# Patient Record
Sex: Female | Born: 2000 | Race: White | Hispanic: No | Marital: Single | Smoking: Never smoker
Health system: Southern US, Community
[De-identification: ages and names within clinical notes are randomized; demographics above are authoritative.]

## PROBLEM LIST (undated history)

## (undated) DIAGNOSIS — A419 Sepsis, unspecified organism: Secondary | ICD-10-CM

## (undated) DIAGNOSIS — Q210 Ventricular septal defect: Secondary | ICD-10-CM

## (undated) DIAGNOSIS — I38 Endocarditis, valve unspecified: Secondary | ICD-10-CM

## (undated) DIAGNOSIS — E282 Polycystic ovarian syndrome: Secondary | ICD-10-CM

## (undated) DIAGNOSIS — Z8669 Personal history of other diseases of the nervous system and sense organs: Secondary | ICD-10-CM

## (undated) HISTORY — PX: OTHER SURGICAL HISTORY: SHX169

## (undated) HISTORY — DX: Endocarditis, valve unspecified: I38

## (undated) HISTORY — DX: Ventricular septal defect: Q21.0

## (undated) HISTORY — DX: Sepsis, unspecified organism: A41.9

## (undated) HISTORY — DX: Personal history of other diseases of the nervous system and sense organs: Z86.69

---
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
## 2018-10-22 ENCOUNTER — Ambulatory Visit (HOSPITAL_COMMUNITY)
Admission: EM | Admit: 2018-10-22 | Discharge: 2018-10-22 | Disposition: A | Discharge status: Home / Self Care | Payer: BC Managed Care – PPO | Attending: Family Medicine | Admitting: Family Medicine

## 2018-10-22 ENCOUNTER — Encounter (HOSPITAL_COMMUNITY): Payer: Self-pay

## 2018-10-22 ENCOUNTER — Other Ambulatory Visit: Payer: Self-pay

## 2018-10-22 DIAGNOSIS — N939 Abnormal uterine and vaginal bleeding, unspecified: Secondary | ICD-10-CM | POA: Insufficient documentation

## 2018-10-22 DIAGNOSIS — R1030 Lower abdominal pain, unspecified: Secondary | ICD-10-CM | POA: Insufficient documentation

## 2018-10-22 LAB — POCT URINALYSIS DIP (DEVICE)
Bilirubin Urine: NEGATIVE
Glucose, UA: NEGATIVE mg/dL
Hgb urine dipstick: NEGATIVE
Ketones, ur: NEGATIVE mg/dL
Leukocytes,Ua: NEGATIVE
Nitrite: NEGATIVE
Protein, ur: NEGATIVE mg/dL
Specific Gravity, Urine: 1.02 (ref 1.005–1.030)
Urobilinogen, UA: 0.2 mg/dL (ref 0.0–1.0)
pH: 7.5 (ref 5.0–8.0)

## 2018-10-22 LAB — POCT PREGNANCY, URINE: Preg Test, Ur: NEGATIVE

## 2018-10-22 MED ORDER — KETOROLAC TROMETHAMINE 30 MG/ML IJ SOLN
INTRAMUSCULAR | Status: AC
  Filled 2018-10-22: qty 1

## 2018-10-22 MED ORDER — FLUCONAZOLE 150 MG PO TABS: 150.0000 mg | ORAL_TABLET | Freq: Every day | ORAL | 0 refills | Status: DC

## 2018-10-22 MED ORDER — KETOROLAC TROMETHAMINE 30 MG/ML IJ SOLN
30.0000 mg | Freq: Once | INTRAMUSCULAR | Status: AC
  Administered 2018-10-22: 30 mg via INTRAMUSCULAR

## 2018-10-22 NOTE — ED Provider Notes (Signed)
[facility]    CSN: [id number] Arrival date & time: 10/22/18  1058      History   Chief Complaint Chief Complaint  Patient presents with  . Abdominal Pain    HPI Laura Stokes is a 18 y.o. female.   Patient is a 18 year old female with past medical history of endocarditis, sepsis, VSD.  She presents today with lower abdominal discomfort, suprapubic pain and vaginal bleeding.  Reporting she is been bleeding for approximately 6 days.  Bleeding is light to moderate.  Reporting she has not had a menstrual cycle since having the IUD placed back in January.  Reporting she felt mildly nauseous and lightheaded this morning.  Does [place]  No obvious vaginal discharge, dysuria, hematuria or urinary frequency.  Reporting she is not currently sexually active. No fever.   ROS per HPI      Past Medical History:  Diagnosis Date  . Endocarditis   . Hx of migraines   . Sepsis (HCC)   . VSD (ventricular septal defect)    history of    Patient Active Problem List   Diagnosis Date Noted  . VSD (ventricular septal defect)   . Endocarditis   . Chest pain 12/20/2017    Past Surgical History:  Procedure Laterality Date  . None      OB History   No obstetric history on file.      Home Medications    Prior to Admission medications   Medication Sig Start Date End Date Taking? Authorizing Provider  fluconazole (DIFLUCAN) 150 MG tablet Take 1 tablet (150 mg total) by mouth daily. 10/22/18   Dahlia ByesBast, Shardae Kleinman A, NP  norethindrone (MICRONOR,CAMILA,ERRIN) 0.35 MG tablet Take 1 tablet by mouth daily.    [provider]  SUMAtriptan (IMITREX) 100 MG tablet Take 100 mg by mouth every 2 (two) hours as needed (for migraines and may repeat in 2 hours if headache persists or recurs).     [provider]    Family History Family History  Problem Relation Age of Onset  . Hyperlipidemia Father     Social History Social History   Tobacco  Use  . Smoking status: Never Smoker  . Smokeless tobacco: Never Used  Substance Use Topics  . Alcohol use: Yes    Comment: Occasional   . Drug use: Never     Allergies   Patient has no known allergies.   Review of Systems Review of Systems   Physical Exam Triage Vital Signs ED Triage Vitals  Enc Vitals Group     BP 10/22/18 1110 111/78     Pulse Rate 10/22/18 1110 72     Resp 10/22/18 1110 18     Temp 10/22/18 1110 98.6 F (37 C)     Temp Source 10/22/18 1110 Oral     SpO2 10/22/18 1110 100 %     Weight 10/22/18 1108 200 lb (90.7 kg)     Height --      Head Circumference --      Peak Flow --      Pain Score 10/22/18 1108 7     Pain Loc --      Pain Edu? --      Excl. in [place]? --    No data found.  Updated Vital Signs BP 111/78 (BP Location: Right Arm)   Pulse 72   Temp 98.6 F (37 C) (Oral)   Resp 18   Wt 200  lb (90.7 kg)   LMP 10/17/2018   SpO2 100%   BMI 29.53 kg/m   Visual Acuity Right Eye Distance:   Left Eye Distance:   Bilateral Distance:    Right Eye Near:   Left Eye Near:    Bilateral Near:     Physical Exam Vitals signs and nursing note reviewed.  Constitutional:      General: She is not in acute distress.    Appearance: She is well-developed. She is not ill-appearing, toxic-appearing or diaphoretic.  HENT:     Head: Normocephalic and atraumatic.  Pulmonary:     Effort: Pulmonary effort is normal.  Abdominal:     General: Abdomen is flat. Bowel sounds are normal. There is no distension or abdominal bruit.     Palpations: Abdomen is soft.     Tenderness: There is abdominal tenderness in the suprapubic area. There is no right CVA tenderness, left CVA tenderness, guarding or rebound.  Genitourinary:    Vagina: Vaginal discharge and bleeding present.     Cervix: No cervical motion tenderness or friability.     Comments: External vaginal exam normal without lesions, swelling  Internal exam- trace dark blood noted in vaginal vault.   Thick, white/yellow, clumpy discharge noted IUD strings visible and intact No CMT or friability Skin:    General: Skin is warm and dry.  Neurological:     Mental Status: She is alert.  Psychiatric:        Mood and Affect: Mood normal.      UC Treatments / Results  Labs (all labs ordered are listed, but only abnormal results are displayed) Labs Reviewed  POC URINE PREG, ED  POCT URINALYSIS DIP (DEVICE)  POCT PREGNANCY, URINE  CERVICOVAGINAL ANCILLARY ONLY    EKG   Radiology No results found.  Procedures Procedures (including critical care time)  Medications Ordered in UC Medications  ketorolac (TORADOL) 30 MG/ML injection 30 mg (30 mg Intramuscular Given 10/22/18 1156)  ketorolac (TORADOL) 30 MG/ML injection (has no administration in time range)    Initial Impression / Assessment and Plan / UC Course  I have reviewed the triage vital signs and the nursing notes.  Pertinent labs & imaging results that were available during my care of the patient were reviewed by me and considered in my medical decision making (see chart for details).     18 year old female with suprapubic discomfort, breakthrough bleeding Exam with minimal trace dark blood and what appears to be yeast in vaginal vault.  Treating for yeast infection. IUD strings intact  No CMT or friability lower abdominal pain and suprapubic tenderness No UTI and negative pregnancy.  Recommended follow up with OB/GYN for further issues.  Toradol for pain.  Final Clinical Impressions(s) / UC Diagnoses   Final diagnoses:  Vaginal bleeding  Lower abdominal pain     Discharge Instructions     Your IUD strings were intact. There was some discharge noted.  We will treat you for yeast infection. Sending your swab for testing.  This should take a couple days for the results. Giving you a shot of Toradol here for the pain This irregular bleeding could be due to infection or just hormonal and a normal menstrual  cycle. If this problem continues I would follow-up with the OB/GYN.    ED Prescriptions    Medication Sig Dispense Auth. Provider   fluconazole (DIFLUCAN) 150 MG tablet Take 1 tablet (150 mg total) by mouth daily. 2 tablet Orvan July, NP  Controlled Substance Prescriptions Laura Stokes Controlled Substance Registry consulted? Not Applicable   Janace Aris, NP 10/22/18 [phone]

## 2018-10-22 NOTE — ED Triage Notes (Signed)
Pt cc abdominal pain and cramps, nauseous and weakness and anxiety .  Pt states she has been bleeding 6 days. Pt states her periods are normal 3 or 4 days. Pt states she has not has a period in 5 or 6 years since starting her birth control.

## 2018-10-22 NOTE — Discharge Instructions (Addendum)
Your IUD strings were intact. There was some discharge noted.  We will treat you for yeast infection. Sending your swab for testing.  This should take a couple days for the results. Giving you a shot of Toradol here for the pain This irregular bleeding could be due to infection or just hormonal and a normal menstrual cycle. If this problem continues I would follow-up with the OB/GYN.

## 2018-10-22 NOTE — ED Notes (Signed)
Patient became dizzy and faint during and after blood draw. Notified Nurse and Provider

## 2018-10-24 LAB — CERVICOVAGINAL ANCILLARY ONLY
Bacterial vaginitis: NEGATIVE
Candida vaginitis: POSITIVE — AB
Chlamydia: NEGATIVE
Neisseria Gonorrhea: NEGATIVE
Trichomonas: NEGATIVE

## 2019-03-13 DIAGNOSIS — Z7189 Other specified counseling: Secondary | ICD-10-CM | POA: Insufficient documentation

## 2019-03-13 NOTE — Progress Notes (Signed)
Cardiology Office Note   Date:  03/16/2019   ID:  Laura Stokes, DOB 28-Oct-2000, MRN [id number]  PCP:  Patient, No Pcp Per  Cardiologist:   No primary care provider on file.   Chief Complaint  Patient presents with  . Shortness of Breath      History of Present Illness: Laura Stokes is a 19 y.o. female who follow up for management of a VSD.  She was followed at the [facility].  There is a description in her records that she was hospitalized over the summer in 2019.  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  She was managed with 6 weeks of antibiotics.  She had cultures proven negative after this.  She was treated with IV Rocephin.  There was evidence of a vegetation on the right ventricular septal wall it was small post.  I do see a post endocarditis echocardiogram that demonstrates small subaortic ventricular septal defect.  She returns for follow up.  She had a urinary tract infection.  She was concerned that this could cause endocarditis.  She is quite nervous about that.  She did not have any symptoms consistent with sepsis.  She did not have fevers or chills.  She had a little bit of shortness of breath the other day getting out of the shower.  She thought this felt a little bit like her endocarditis but this seems to have resolved.  Her symptoms were hematuria, burning and frequency.  These are better.  She has been treated with ciprofloxacin.  She is otherwise not had any cardiac complaints.  She has had no chest pressure, neck or arm discomfort.  She has had no palpitations, presyncope or syncope.   Past Medical History:  Diagnosis Date  . Endocarditis   . Hx of migraines   . Sepsis (HCC)   . VSD (ventricular septal defect)    history of    PSH:  None   Current Outpatient Medications  Medication Sig Dispense Refill  . fluconazole (DIFLUCAN) 150 MG tablet Take 1 tablet (150 mg total) by mouth daily. 2 tablet 0  .  norethindrone (MICRONOR,CAMILA,ERRIN) 0.35 MG tablet Take 1 tablet by mouth daily.    . SUMAtriptan (IMITREX) 100 MG tablet Take 100 mg by mouth every 2 (two) hours as needed (for migraines and may repeat in 2 hours if headache persists or recurs).      No current facility-administered medications for this visit.    Allergies:   Patient has no known allergies.    Social History:  The patient  reports that she has never smoked. She has never used smokeless tobacco. She reports current alcohol use. She reports that she does not use drugs.   Family History:  The patient's family history includes Hyperlipidemia in her father.    ROS:  Please see the history of present illness.   Otherwise, review of systems are positive for none.   All other systems are reviewed and negative.    PHYSICAL EXAM: VS:  BP 94/68   Pulse 74   Temp 97.7 F (36.5 C)   Ht 5\' 9"  (1.753 m)   Wt 209 lb (94.8 kg)   BMI 30.86 kg/m  , BMI Body mass index is 30.86 kg/m. GENERAL:  Well appearing NECK:  No jugular venous distention, waveform within normal limits, carotid upstroke brisk and symmetric, no bruits, no thyromegaly LUNGS:  Clear to auscultation bilaterally CHEST:  Unremarkable HEART:  PMI  not displaced or sustained,S1 and S2 within normal limits, no S3, no S4, no clicks, no rubs, 3 out of 6 holosystolic murmur, no diastolic murmurs ABD:  Flat, positive bowel sounds normal in frequency in pitch, no bruits, no rebound, no guarding, no midline pulsatile mass, no hepatomegaly, no splenomegaly EXT:  2 plus pulses throughout, no edema, no cyanosis no clubbin   EKG:  EKG is  ordered today. The ekg ordered today demonstrates rhythm, rate 74, RSR prime V1 with incomplete right bundle branch block and left axis deviation, no acute ST-T wave changes.   Recent Labs: No results found for requested labs within last 8760 hours.    Lipid Panel No results found for: CHOL, TRIG, HDL, CHOLHDL, VLDL, LDLCALC, LDLDIRECT     Wt Readings from Last 3 Encounters:  03/14/19 209 lb (94.8 kg)  10/22/18 200 lb (90.7 kg)  12/21/17 203 lb 4.2 oz (92.2 kg) (98 %, Z= 1.99)*   * Growth percentiles are based on CDC (Girls, 2-20 Years) data.      Other studies Reviewed: Additional studies/ records that were reviewed today include: Outside cardiology records. Review of the above records demonstrates:  Please see elsewhere in the note.     ASSESSMENT AND PLAN:  VSD:    There is no suggestion of endocarditis.  We discussed the fact that without bacteremia this could not happen.  She is going to check and see what kind of organism if any was cultured from her urine.  She will make sure that she has had complete symptomatic resolution.  Eventually she is get a follow-up with cardiology when she goes back to [place] and I have suggested that she have an adult congenital specialist.   Current medicines are reviewed at length with the patient today.  The patient does not have concerns regarding medicines.  The following changes have been made:  None  Labs/ tests ordered today include: None  Orders Placed This Encounter  Procedures  . EKG 12-Lead     Disposition:   FU with me as needed.     Signed, Minus Breeding, MD  03/16/2019 7:05 AM    [name]

## 2019-03-14 ENCOUNTER — Ambulatory Visit (INDEPENDENT_AMBULATORY_CARE_PROVIDER_SITE_OTHER): Payer: BC Managed Care – PPO | Admitting: Cardiology

## 2019-03-14 ENCOUNTER — Encounter: Payer: Self-pay | Admitting: Cardiology

## 2019-03-14 ENCOUNTER — Other Ambulatory Visit: Payer: Self-pay

## 2019-03-14 VITALS — BP 94/68 | HR 74 | Temp 97.7°F | Ht 69.0 in | Wt 209.0 lb

## 2019-03-14 DIAGNOSIS — Q210 Ventricular septal defect: Secondary | ICD-10-CM

## 2019-03-14 DIAGNOSIS — R0602 Shortness of breath: Secondary | ICD-10-CM | POA: Diagnosis not present

## 2019-03-14 NOTE — Patient Instructions (Signed)
Medication Instructions:  No Changes *If you need a refill on your cardiac medications before your next appointment, please call your pharmacy*  Lab Work: None  Testing/Procedures: None  Follow-Up: At [facility], you and your health needs are our priority.  As part of our continuing mission to provide you with exceptional heart care, we have created designated Provider Care Teams.  These Care Teams include your primary Cardiologist (physician) and Advanced Practice Providers (APPs -  Physician Assistants and Nurse Practitioners) who all work together to provide you with the care you need, when you need it.  Other Instructions Follow up as needed  

## 2019-03-16 ENCOUNTER — Encounter: Payer: Self-pay | Admitting: Cardiology

## 2019-04-26 ENCOUNTER — Other Ambulatory Visit: Payer: Self-pay

## 2019-04-26 ENCOUNTER — Encounter (HOSPITAL_COMMUNITY): Payer: Self-pay | Admitting: Family Medicine

## 2019-04-26 ENCOUNTER — Ambulatory Visit (HOSPITAL_COMMUNITY)
Admission: EM | Admit: 2019-04-26 | Discharge: 2019-04-26 | Disposition: A | Discharge status: Home / Self Care | Payer: BC Managed Care – PPO | Attending: Family Medicine | Admitting: Family Medicine

## 2019-04-26 DIAGNOSIS — G43011 Migraine without aura, intractable, with status migrainosus: Secondary | ICD-10-CM | POA: Diagnosis not present

## 2019-04-26 MED ORDER — ONDANSETRON 4 MG PO TBDP
ORAL_TABLET | ORAL | Status: AC
  Filled 2019-04-26: qty 1

## 2019-04-26 MED ORDER — ONDANSETRON 8 MG PO TBDP: 8.0000 mg | ORAL_TABLET | Freq: Three times a day (TID) | ORAL | 0 refills | Status: DC | PRN

## 2019-04-26 MED ORDER — DEXAMETHASONE SODIUM PHOSPHATE 10 MG/ML IJ SOLN
INTRAMUSCULAR | Status: AC
  Filled 2019-04-26: qty 1

## 2019-04-26 MED ORDER — DEXAMETHASONE SODIUM PHOSPHATE 10 MG/ML IJ SOLN
10.0000 mg | Freq: Once | INTRAMUSCULAR | Status: AC
  Administered 2019-04-26: 10 mg via INTRAMUSCULAR

## 2019-04-26 MED ORDER — ONDANSETRON 4 MG PO TBDP
4.0000 mg | ORAL_TABLET | Freq: Once | ORAL | Status: AC
  Administered 2019-04-26: 4 mg via ORAL

## 2019-04-26 NOTE — ED Provider Notes (Signed)
[facility]    CSN: [id number] Arrival date & time: 04/26/19  1130      History   Chief Complaint Chief Complaint  Patient presents with  . Migraine    HPI Laura Stokes is a 19 y.o. female.   Is a 19 year old woman, an established [place] urgent care patient, who is presenting with headache.  She believes that this represents a migraine.  It started last night at around 8:00 on the left side of her head and is now migrated to the occipital area of her head.  She has photophobia and some nausea.  Patient has a history of migraines.  This time she took 3 different medications including a triptan over the last 24 hours, none of which relieved her pain.  Patient is in school and is possibly taking physical exam today.  She has had no stiff neck or fever.  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     Past Medical History:  Diagnosis Date  . Endocarditis   . Hx of migraines   . Sepsis (HCC)   . VSD (ventricular septal defect)    history of    Patient Active Problem List   Diagnosis Date Noted  . Educated about COVID-19 virus infection 03/13/2019  . VSD (ventricular septal defect)   . Endocarditis   . Chest pain 12/20/2017    Past Surgical History:  Procedure Laterality Date  . None    . oral surgeries x3-4      OB History   No obstetric history on file.      Home Medications    Prior to Admission medications   Medication Sig Start Date End Date Taking? Authorizing Provider  Diclofenac Potassium,Migraine, (CAMBIA PO) Take by mouth.   Yes [provider]  Rimegepant Sulfate (NURTEC PO) Take by mouth.   Yes [provider]  RIZATRIPTAN BENZOATE PO Take by mouth.   Yes [provider]  UNABLE TO FIND Med Name: Tylenol w/ caffeine   Yes [provider]  fluconazole (DIFLUCAN) 150 MG tablet Take 1 tablet (150 mg total) by  mouth daily. 10/22/18   Dahlia Byes A, NP  norethindrone (MICRONOR,CAMILA,ERRIN) 0.35 MG tablet Take 1 tablet by mouth daily.    [provider]  ondansetron (ZOFRAN-ODT) 8 MG disintegrating tablet Take 1 tablet (8 mg total) by mouth every 8 (eight) hours as needed for nausea. 04/26/19   Elvina Sidle, MD  SUMAtriptan (IMITREX) 100 MG tablet Take 100 mg by mouth every 2 (two) hours as needed (for migraines and may repeat in 2 hours if headache persists or recurs).     [provider]    Family History Family History  Problem Relation Age of Onset  . Hyperlipidemia Father     Social History Social History   Tobacco Use  . Smoking status: Never Smoker  . Smokeless tobacco: Never Used  Substance Use Topics  . Alcohol use: Yes    Comment: Occasional   . Drug use: Never     Allergies   Patient has no known allergies.   Review of Systems Review of Systems  Eyes: Positive for photophobia.  Gastrointestinal: Positive for nausea. Negative for vomiting.  Neurological: Positive for headaches.  All other systems reviewed and are negative.    Physical Exam Triage Vital Signs ED Triage Vitals  Enc Vitals Group     BP  Pulse      Resp      Temp      Temp src      SpO2      Weight      Height      Head Circumference      Peak Flow      Pain Score      Pain Loc      Pain Edu?      Excl. in [place]?    No data found.  Updated Vital Signs BP 116/83   Pulse (!) 55   Temp 97.9 F (36.6 C) (Oral)   Resp 16   SpO2 100%   Physical Exam Vitals and nursing note reviewed.  Constitutional:      General: She is not in acute distress.    Appearance: Normal appearance. She is normal weight. She is not ill-appearing.  HENT:     Head: Normocephalic and atraumatic.     Nose: Nose normal.  Eyes:     Conjunctiva/sclera: Conjunctivae normal.  Cardiovascular:     Rate and Rhythm: Normal rate and regular rhythm.     Heart sounds: Murmur present.     Comments:  3/6 systolic murmur best heard left sternal border Pulmonary:     Effort: Pulmonary effort is normal.     Breath sounds: Normal breath sounds.  Musculoskeletal:        General: Normal range of motion.     Cervical back: Normal range of motion and neck supple.  Skin:    General: Skin is warm and dry.  Neurological:     General: No focal deficit present.     Mental Status: She is alert.  Psychiatric:        Mood and Affect: Mood normal.        Behavior: Behavior normal.        Thought Content: Thought content normal.        Judgment: Judgment normal.      UC Treatments / Results  Labs (all labs ordered are listed, but only abnormal results are displayed) Labs Reviewed - No data to display  EKG   Radiology No results found.  Procedures Procedures (including critical care time)  Medications Ordered in UC Medications  ondansetron (ZOFRAN-ODT) disintegrating tablet 4 mg (has no administration in time range)  dexamethasone (DECADRON) injection 10 mg (has no administration in time range)    Initial Impression / Assessment and Plan / UC Course  I have reviewed the triage vital signs and the nursing notes.  Pertinent labs & imaging results that were available during my care of the patient were reviewed by me and considered in my medical decision making (see chart for details).    Final Clinical Impressions(s) / UC Diagnoses   Final diagnoses:  Intractable migraine without aura and with status migrainosus   Discharge Instructions   None    ED Prescriptions    Medication Sig Dispense Auth. Provider   ondansetron (ZOFRAN-ODT) 8 MG disintegrating tablet Take 1 tablet (8 mg total) by mouth every 8 (eight) hours as needed for nausea. 12 tablet Robyn Haber, MD     I have reviewed the PDMP during this encounter.   Robyn Haber, MD 04/26/19 1345

## 2019-04-26 NOTE — ED Triage Notes (Signed)
Reports starting with her typical migraine @ 1800 last night.  She said this one started "more intense than usual" and is lasting longer than usual, but has same quality as her migraines.  C/O nausea, sensitivity to light and sound. Has taken Nortex, Aristriptopan, Cambia, Tyl.  States meds brought pain "from a 7 to a 3", but states unable to completely get rid of it.

## 2020-01-21 ENCOUNTER — Other Ambulatory Visit: Payer: Self-pay

## 2020-01-21 ENCOUNTER — Encounter (HOSPITAL_COMMUNITY): Payer: Self-pay

## 2020-01-21 ENCOUNTER — Ambulatory Visit (HOSPITAL_COMMUNITY)
Admission: EM | Admit: 2020-01-21 | Discharge: 2020-01-21 | Disposition: A | Discharge status: Home / Self Care | Payer: BC Managed Care – PPO

## 2020-01-21 ENCOUNTER — Emergency Department (HOSPITAL_COMMUNITY)
Admission: EM | Admit: 2020-01-21 | Discharge: 2020-01-21 | Disposition: A | Discharge status: Home / Self Care | Payer: BC Managed Care – PPO | Attending: Emergency Medicine | Admitting: Emergency Medicine

## 2020-01-21 ENCOUNTER — Encounter (HOSPITAL_COMMUNITY): Payer: Self-pay | Admitting: *Deleted

## 2020-01-21 DIAGNOSIS — Z5321 Procedure and treatment not carried out due to patient leaving prior to being seen by health care provider: Secondary | ICD-10-CM | POA: Insufficient documentation

## 2020-01-21 DIAGNOSIS — G43909 Migraine, unspecified, not intractable, without status migrainosus: Secondary | ICD-10-CM | POA: Insufficient documentation

## 2020-01-21 DIAGNOSIS — H538 Other visual disturbances: Secondary | ICD-10-CM | POA: Diagnosis not present

## 2020-01-21 DIAGNOSIS — R519 Headache, unspecified: Secondary | ICD-10-CM

## 2020-01-21 NOTE — ED Triage Notes (Signed)
The pt is here with  A migraine headache today  [facility] and [facility] for treatemnt  C/o blurred vision difficulty thinking  Headache is worse  Standing lmp unknown birth control

## 2020-01-21 NOTE — ED Provider Notes (Addendum)
[facility]    CSN: [id number] Arrival date & time: 01/21/20  8127      History   Chief Complaint Chief Complaint  Patient presents with  . Migraine  . Nausea    HPI Laura Stokes is a 19 y.o. female presenting for migraine for 2 hours. States that this is the worst migraine of her life. Headache began when she was sitting in an interview, which was very stressful. She also notes she hasn't eaten enough today, which has been a migraine trigger in the past. She's having her typical migraine symptoms, plus weakness in her legs and difficulty walking, which is new for her. Endorses blurry vision, photophobia, nausea, dizziness especially with standing. She took her typical migraine medication (nurtec and metoclopramide) without improvement. She has not taken her triptan - headache doctor told her not to take this when having "puffy eyes" per pt.   HPI  Past Medical History:  Diagnosis Date  . Endocarditis   . Hx of migraines   . Sepsis (HCC)   . VSD (ventricular septal defect)    history of    Patient Active Problem List   Diagnosis Date Noted  . Educated about COVID-19 virus infection 03/13/2019  . VSD (ventricular septal defect)   . Endocarditis   . Chest pain 12/20/2017    Past Surgical History:  Procedure Laterality Date  . None    . oral surgeries x3-4      OB History   No obstetric history on file.      Home Medications    Prior to Admission medications   Medication Sig Start Date End Date Taking? Authorizing Provider  Diclofenac Potassium,Migraine, (CAMBIA PO) Take by mouth.    [provider]  fluconazole (DIFLUCAN) 150 MG tablet Take 1 tablet (150 mg total) by mouth daily. 10/22/18   Dahlia Byes A, NP  norethindrone (MICRONOR,CAMILA,ERRIN) 0.35 MG tablet Take 1 tablet by mouth daily.    [provider]  ondansetron (ZOFRAN-ODT) 8 MG disintegrating tablet Take 1 tablet (8 mg total) by mouth every 8 (eight) hours as needed for  nausea. 04/26/19   Elvina Sidle, MD  Rimegepant Sulfate (NURTEC PO) Take by mouth.    [provider]  RIZATRIPTAN BENZOATE PO Take by mouth.    [provider]  SUMAtriptan (IMITREX) 100 MG tablet Take 100 mg by mouth every 2 (two) hours as needed (for migraines and may repeat in 2 hours if headache persists or recurs).     [provider]  UNABLE TO FIND Med Name: Tylenol w/ caffeine    [provider]    Family History Family History  Problem Relation Age of Onset  . Hyperlipidemia Father     Social History Social History   Tobacco Use  . Smoking status: Never Smoker  . Smokeless tobacco: Never Used  Vaping Use  . Vaping Use: Never used  Substance Use Topics  . Alcohol use: Yes    Comment: Occasional   . Drug use: Never     Allergies   Patient has no known allergies.   Review of Systems Review of Systems  Constitutional: Positive for diaphoresis.  Eyes: Positive for photophobia and visual disturbance (blurred vision).  Neurological: Positive for dizziness, weakness and headaches. Negative for syncope, facial asymmetry, speech difficulty and numbness.     Physical Exam Triage Vital Signs ED Triage Vitals  Enc Vitals Group     BP 01/21/20 1933 101/61     Pulse Rate  01/21/20 1933 68     Resp 01/21/20 1933 18     Temp 01/21/20 1933 98.2 F (36.8 C)     Temp Source 01/21/20 1933 Oral     SpO2 01/21/20 1933 100 %     Weight --      Height --      Head Circumference --      Peak Flow --      Pain Score 01/21/20 1932 7     Pain Loc --      Pain Edu? --      Excl. in GC? --    No data found.  Updated Vital Signs BP 101/61 (BP Location: Right Arm)   Pulse 68   Temp 98.2 F (36.8 C) (Oral)   Resp 18   SpO2 100%   Visual Acuity Right Eye Distance:   Left Eye Distance:   Bilateral Distance:    Right Eye Near:   Left Eye Near:    Bilateral Near:     Physical Exam Vitals reviewed.  Constitutional:       Appearance: She is ill-appearing.  HENT:     Head: Normocephalic and atraumatic.  Eyes:     Extraocular Movements: Extraocular movements intact.     Pupils: Pupils are equal, round, and reactive to light.  Cardiovascular:     Rate and Rhythm: Normal rate and regular rhythm.     Comments: 3/6 systolic murmur best heard left sternal border Pulmonary:     Effort: Pulmonary effort is normal.     Breath sounds: Normal breath sounds.  Neurological:     Mental Status: She is alert and oriented to person, place, and time.     Sensory: No sensory deficit.     Motor: Weakness present.     Coordination: Coordination abnormal.     Gait: Gait abnormal.     Comments: CN 2-12 grossly intact. Weakness in bilateral UEs and LEs. Pt with stumbling walk.       UC Treatments / Results  Labs (all labs ordered are listed, but only abnormal results are displayed) Labs Reviewed - No data to display  EKG   Radiology No results found.  Procedures Procedures (including critical care time)  Medications Ordered in UC Medications - No data to display  Initial Impression / Assessment and Plan / UC Course  I have reviewed the triage vital signs and the nursing notes.  Pertinent labs & imaging results that were available during my care of the patient were reviewed by me and considered in my medical decision making (see chart for details).     Patient with new onset of worst headache of life and new onset of leg weakness. Patient sent straight to [facility] ED. She is hemodynamically stable to transfer herself in a vehicle driven by and accompanied by close friend. She understands that if symptoms worsen or change during self transport, she must immediately call EMS/ambulance. Patient verbalizes understanding and agreement, and that she will head straight to the ED.   Final Clinical Impressions(s) / UC Diagnoses   Final diagnoses:  Worst headache of life     Discharge Instructions     Head to  [facility] ED for further workup.     ED Prescriptions    None     PDMP not reviewed this encounter.     Rhys Martini, PA-C 01/22/20 [phone]

## 2020-01-21 NOTE — Discharge Instructions (Addendum)
Head to [facility] ED for further workup.

## 2020-01-21 NOTE — ED Notes (Signed)
"  I'm just going to leave and go home." Patient LWBS. Moving OTF.

## 2020-01-21 NOTE — ED Notes (Signed)
Patient is being discharged from the Urgent Care and sent to the Emergency Department via POV . Per Alen Blew, PA, patient is in need of higher level of care due to severe migraine. Patient is aware and verbalizes understanding of plan of care.  Vitals:   01/21/20 1933  BP: 101/61  Pulse: 68  Resp: 18  Temp: 98.2 F (36.8 C)  SpO2: 100%

## 2020-01-21 NOTE — ED Triage Notes (Signed)
Pt c/o severe migraine X 1 hr ago. Pt states she took medicine and it did not relieve the pain.  Pt states the migraine she has is worse than its ever been. Pt states she is nauseated and has been having blurry vision during the migraines. She states she is dizzy and feels pressure on her head.

## 2020-01-30 IMAGING — CT CT ANGIO CHEST
2 of 7 series · 18 of 46 positions shown · IV contrast (APPLIED)
Comparison: One-view chest x-ray 12/20/2017

CLINICAL DATA: PE suspected, high pretest probability. Left-sided
chest pain and dyspnea. Personal history of endocarditis.

EXAM:
CT ANGIOGRAPHY CHEST WITH CONTRAST
TECHNIQUE: Multidetector CT imaging of the chest was performed using the
standard protocol during bolus administration of intravenous
contrast. Multiplanar CT image reconstructions and MIPs were
obtained to evaluate the vascular anatomy.
CONTRAST:  100mL 5UVH5K-UFK IOPAMIDOL (5UVH5K-UFK) INJECTION 76%

[Series 7: thins · axial · 0.71mm/px · z∈[+1316,+1596]mm · 15 of 452 slices shown]
[im 26/452  lung]
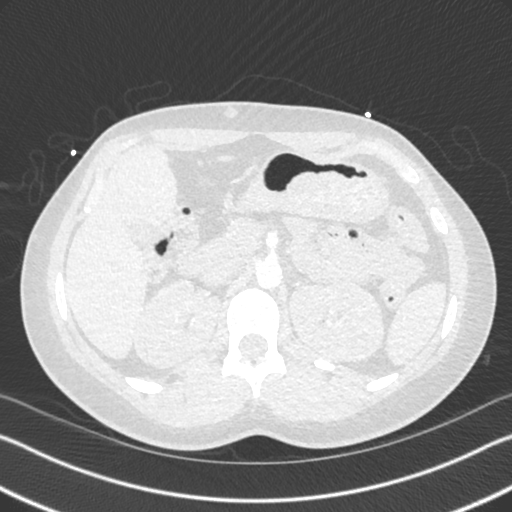
[im 51/452  soft-tissue]
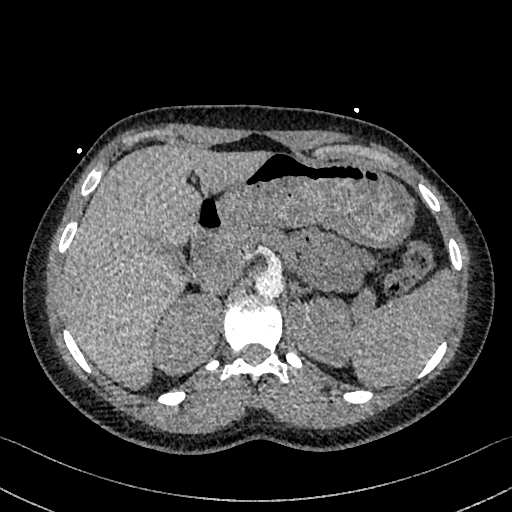
[im 76/452  lung]
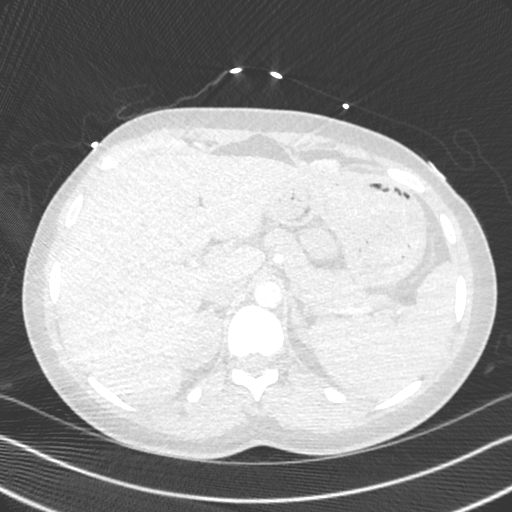
[im 101/452  soft-tissue]
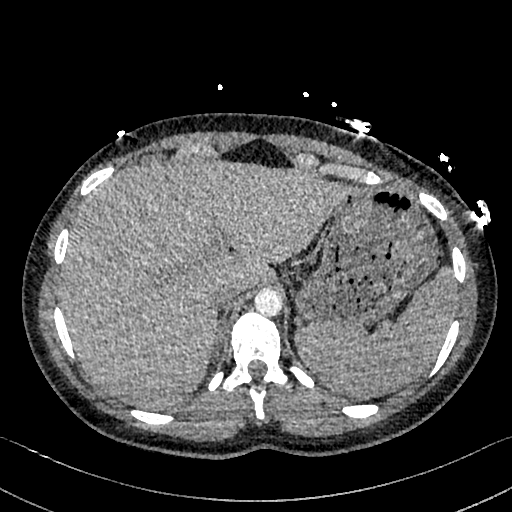
[im 151/452  lung]
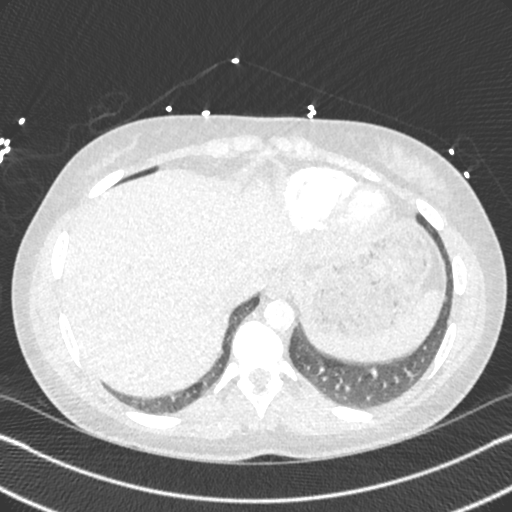
[im 176/452  soft-tissue]
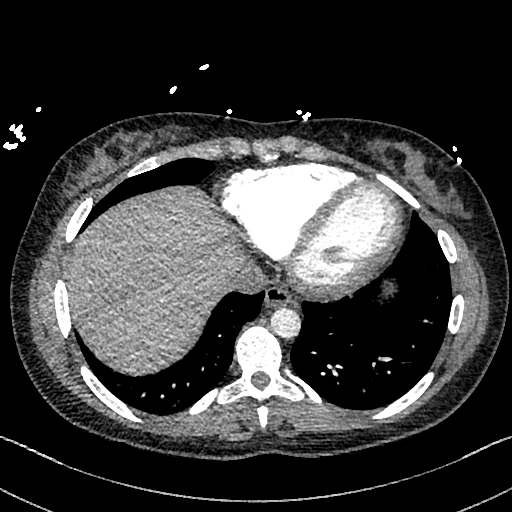
[im 201/452  lung]
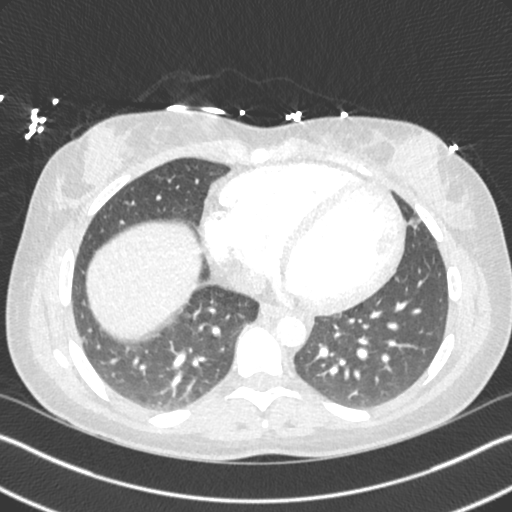
[im 226/452  soft-tissue]
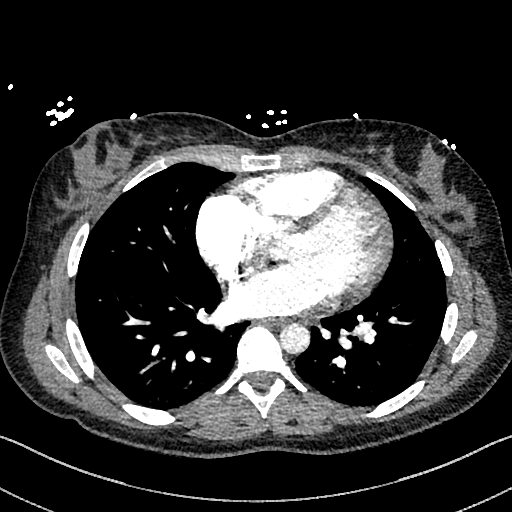
[im 251/452  lung]
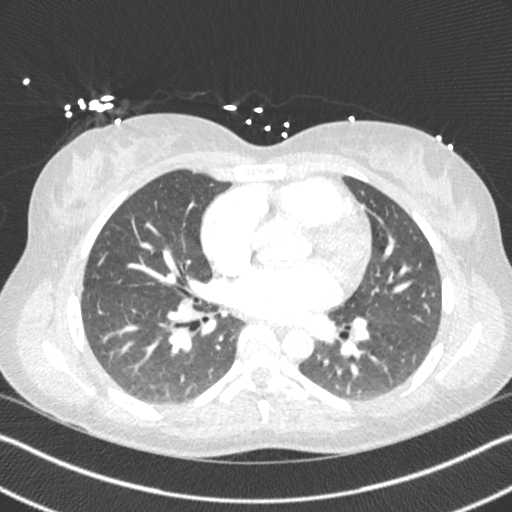
[im 276/452  soft-tissue]
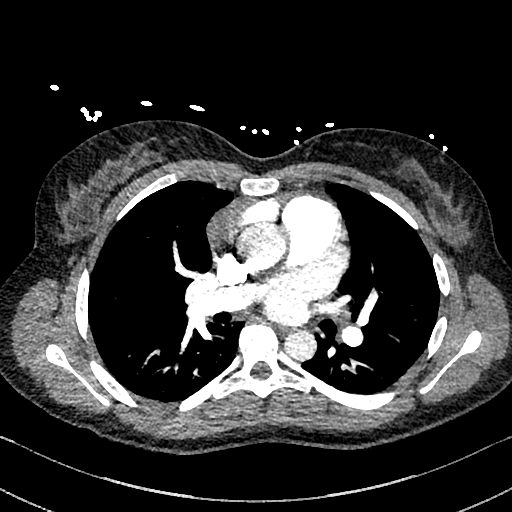
[im 301/452  lung]
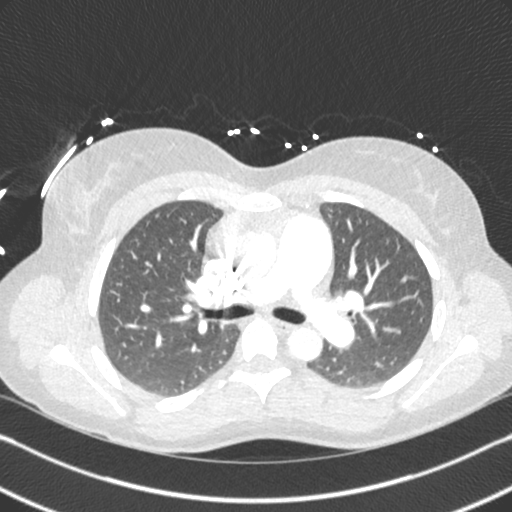
[im 351/452  soft-tissue]
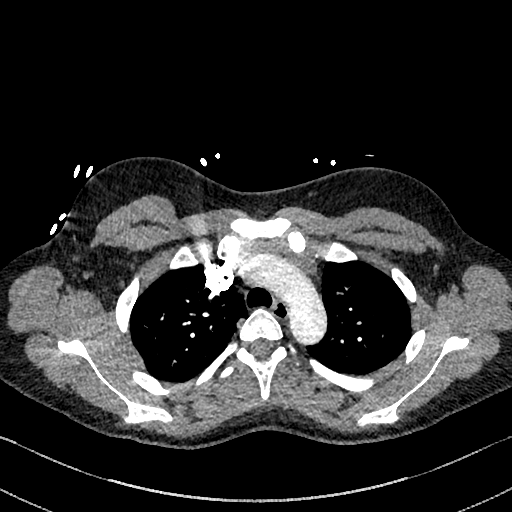
[im 376/452  lung]
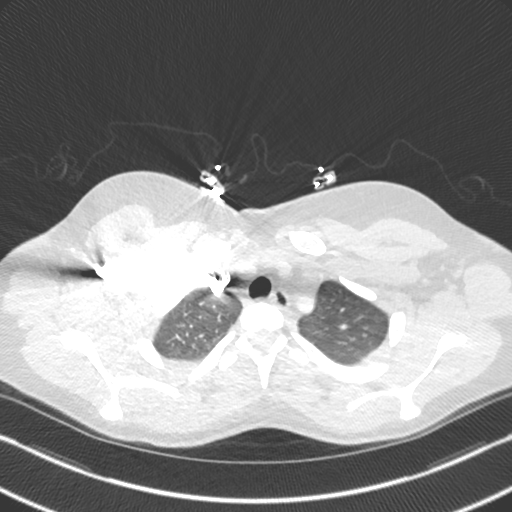
[im 401/452  soft-tissue]
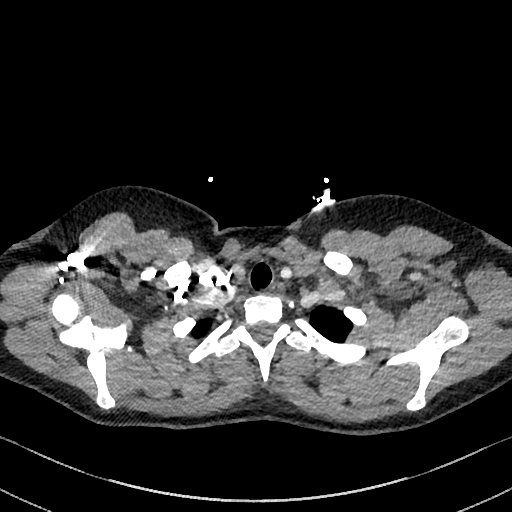
[im 426/452  lung]
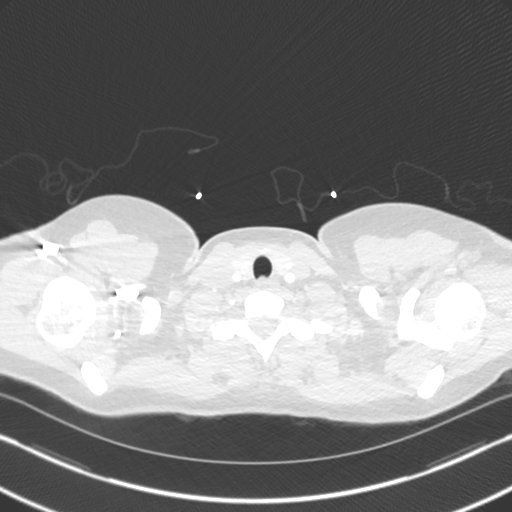

[Series 8: cor · coronal · 0.62mm/px · 3 of 132 slices shown]
[im 33/132  soft-tissue]
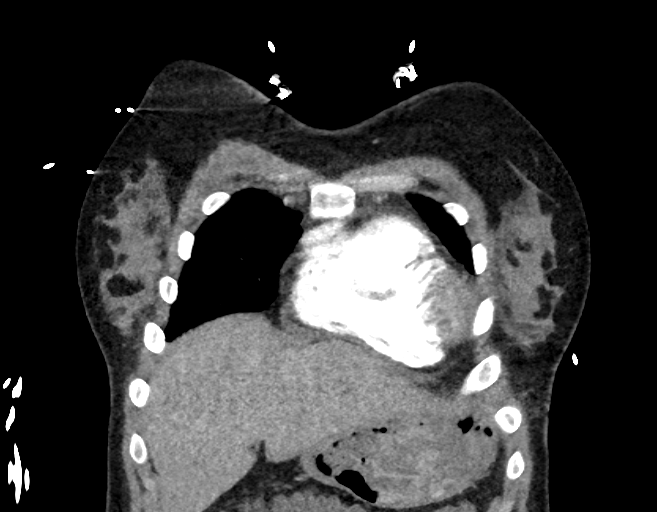
[im 66/132  soft-tissue]
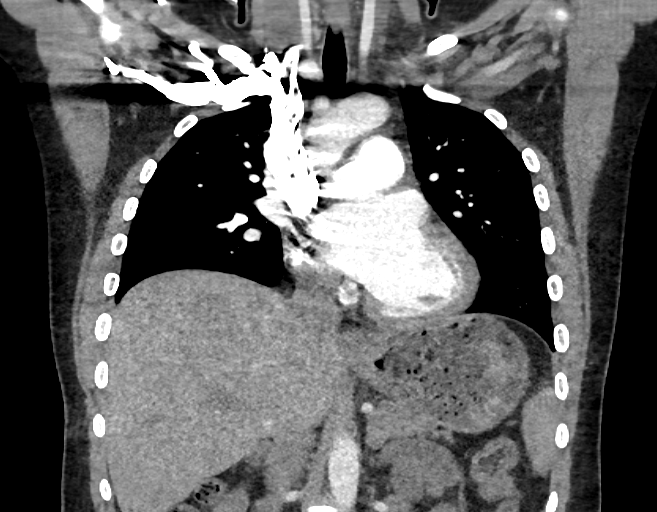
[im 99/132  soft-tissue]
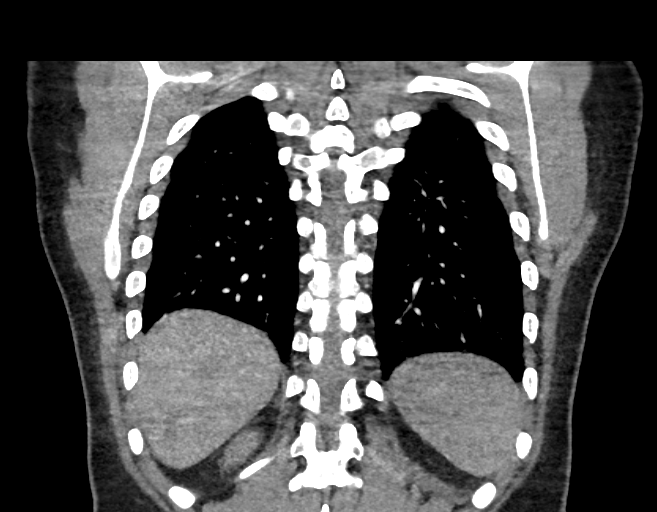

[18 of 46 positions shown; findings below may reference images not displayed]

FINDINGS: Cardiovascular: The heart size is normal. Aorta is visualized and
within normal limits. Pulmonary artery opacification is excellent.
There are no focal filling defects to suggest pulmonary embolus.
Pulmonary artery size is normal.

Mediastinum/Nodes: No significant mediastinal, axillary, or hilar
adenopathy is present.

Lungs/Pleura: The lungs are clear without focal nodule, mass, or
airspace disease. No significant pleural effusion is present.

Upper Abdomen: Limited imaging of the upper abdomen is unremarkable.

Musculoskeletal: Within normal limits.

Review of the MIP images confirms the above findings.
IMPRESSION: 1. No pulmonary embolus.
2. Negative CTA of the chest.

## 2021-11-29 ENCOUNTER — Encounter (HOSPITAL_BASED_OUTPATIENT_CLINIC_OR_DEPARTMENT_OTHER): Payer: Self-pay

## 2021-11-29 ENCOUNTER — Emergency Department (HOSPITAL_BASED_OUTPATIENT_CLINIC_OR_DEPARTMENT_OTHER)
Admission: EM | Admit: 2021-11-29 | Discharge: 2021-11-29 | Discharge status: Against Medical Advice | Payer: BC Managed Care – PPO | Attending: Emergency Medicine | Admitting: Emergency Medicine

## 2021-11-29 ENCOUNTER — Other Ambulatory Visit: Payer: Self-pay

## 2021-11-29 ENCOUNTER — Ambulatory Visit
Admission: EM | Admit: 2021-11-29 | Discharge: 2021-11-29 | Disposition: A | Discharge status: Home / Self Care | Payer: BC Managed Care – PPO

## 2021-11-29 DIAGNOSIS — R519 Headache, unspecified: Secondary | ICD-10-CM

## 2021-11-29 DIAGNOSIS — R112 Nausea with vomiting, unspecified: Secondary | ICD-10-CM

## 2021-11-29 DIAGNOSIS — Z5321 Procedure and treatment not carried out due to patient leaving prior to being seen by health care provider: Secondary | ICD-10-CM | POA: Diagnosis not present

## 2021-11-29 HISTORY — DX: Polycystic ovarian syndrome: E28.2

## 2021-11-29 MED ORDER — ONDANSETRON 8 MG PO TBDP: 8.0000 mg | ORAL_TABLET | Freq: Three times a day (TID) | ORAL | 0 refills | Status: AC | PRN

## 2021-11-29 MED ORDER — ONDANSETRON 8 MG PO TBDP
8.0000 mg | ORAL_TABLET | Freq: Once | ORAL | Status: AC
  Administered 2021-11-29: 8 mg via ORAL

## 2021-11-29 MED ORDER — BUTALBITAL-APAP-CAFFEINE 50-325-40 MG PO TABS: 1.0000 | ORAL_TABLET | Freq: Three times a day (TID) | ORAL | 0 refills | Status: AC | PRN

## 2021-11-29 NOTE — Discharge Instructions (Addendum)
Advised patient may take Fioricet daily or as needed for breakthrough migraine headache pain.  Advised may take Zofran daily or as needed for nausea.  Advised if symptoms worsen go to nearest ED for further evaluation.

## 2021-11-29 NOTE — ED Triage Notes (Signed)
Pt presents with c/o HA that began yesterday. Pt endorses hx of migraines

## 2021-11-29 NOTE — ED Provider Notes (Signed)
[facility]    CSN: [id number] Arrival date & time: 11/29/21  1805      History   Chief Complaint Chief Complaint  Patient presents with   Headache    HPI Laura Stokes is a 21 y.o. female.   HPI 21 year old female presents with headache nausea and vomiting that began yesterday.  Reports history of migraines.  Patient reports taking rizatriptan, Nurtec, Ibuprofen, Reglan prior to this office visit this evening.  PMH significant for morbid obesity, endocarditis, and history of migraines.  Patient is accompanied by her boyfriend/significant other tonight.  Past Medical History:  Diagnosis Date   Endocarditis    Hx of migraines    PCOS (polycystic ovarian syndrome)    Sepsis (HCC)    VSD (ventricular septal defect)    history of    Patient Active Problem List   Diagnosis Date Noted   Educated about COVID-19 virus infection 03/13/2019   VSD (ventricular septal defect)    Endocarditis    Chest pain 12/20/2017    Past Surgical History:  Procedure Laterality Date   None     oral surgeries x3-4      OB History   No obstetric history on file.      Home Medications    Prior to Admission medications   Medication Sig Start Date End Date Taking? Authorizing Provider  butalbital-acetaminophen-caffeine (FIORICET) 50-325-40 MG tablet Take 1 tablet by mouth every 8 (eight) hours as needed for headache. 11/29/21 11/29/22 Yes Trevor Iha, FNP  metFORMIN (GLUCOPHAGE) 500 MG tablet Take by mouth 2 (two) times daily with a meal.   Yes [provider]  ondansetron (ZOFRAN-ODT) 8 MG disintegrating tablet Take 1 tablet (8 mg total) by mouth every 8 (eight) hours as needed for nausea or vomiting. 11/29/21  Yes Trevor Iha, FNP  Diclofenac Potassium,Migraine, (CAMBIA PO) Take by mouth.    [provider]  RIZATRIPTAN BENZOATE PO Take by mouth.    [provider]  UNABLE TO FIND Med Name: Tylenol w/ caffeine    [provider]     Family History Family History  Problem Relation Age of Onset   Hyperlipidemia Father     Social History Social History   Tobacco Use   Smoking status: Never   Smokeless tobacco: Never  Vaping Use   Vaping Use: Never used  Substance Use Topics   Alcohol use: Yes    Comment: Occasional    Drug use: Never     Allergies   Patient has no known allergies.   Review of Systems Review of Systems  Neurological:  Positive for headaches.  All other systems reviewed and are negative.    Physical Exam Triage Vital Signs ED Triage Vitals  Enc Vitals Group     BP 11/29/21 1816 113/77     Pulse Rate 11/29/21 1816 60     Resp 11/29/21 1816 12     Temp 11/29/21 1816 98.4 F (36.9 C)     Temp Source 11/29/21 1816 Oral     SpO2 11/29/21 1816 99 %     Weight --      Height --      Head Circumference --      Peak Flow --      Pain Score 11/29/21 1817 6     Pain Loc --      Pain Edu? --      Excl. in GC? --    No data found.  Updated Vital Signs BP  113/77 (BP Location: Right Arm)   Pulse 60   Temp 98.4 F (36.9 C) (Oral)   Resp 12   SpO2 99%    Physical Exam Vitals and nursing note reviewed.  Constitutional:      General: She is not in acute distress.    Appearance: She is well-developed. She is obese. She is ill-appearing. She is not toxic-appearing or diaphoretic.  HENT:     Head: Normocephalic and atraumatic.     Right Ear: Tympanic membrane, ear canal and external ear normal.     Left Ear: Tympanic membrane, ear canal and external ear normal.     Mouth/Throat:     Mouth: Mucous membranes are moist.  Eyes:     Extraocular Movements: Extraocular movements intact.     Pupils: Pupils are equal, round, and reactive to light.  Cardiovascular:     Rate and Rhythm: Normal rate and regular rhythm.     Heart sounds: Normal heart sounds.  Pulmonary:     Effort: Pulmonary effort is normal.     Breath sounds: Normal breath sounds. No wheezing, rhonchi or rales.   Musculoskeletal:        General: Normal range of motion.     Cervical back: Normal range of motion and neck supple. No tenderness.  Lymphadenopathy:     Cervical: No cervical adenopathy.  Skin:    General: Skin is warm and dry.  Neurological:     Mental Status: She is alert and oriented to person, place, and time.     Cranial Nerves: No cranial nerve deficit or dysarthria.      UC Treatments / Results  Labs (all labs ordered are listed, but only abnormal results are displayed) Labs Reviewed - No data to display  EKG   Radiology No results found.  Procedures Procedures (including critical care time)  Medications Ordered in UC Medications  ondansetron (ZOFRAN-ODT) disintegrating tablet 8 mg (8 mg Oral Given 11/29/21 1830)    Initial Impression / Assessment and Plan / UC Course  I have reviewed the triage vital signs and the nursing notes.  Pertinent labs & imaging results that were available during my care of the patient were reviewed by me and considered in my medical decision making (see chart for details).     MDM: 1.  Bad headache-Rx'd Fioricet; 2.  Nausea and vomiting-Zofran 8 mg given once in clinic, Rx'd Zofran. Advised patient may take Fioricet daily or as needed for breakthrough migraine headache pain.  Advised may take Zofran daily or as needed for nausea.  Advised if symptoms worsen go to nearest ED for further evaluation.  Patient discharged home, hemodynamically stable. Final Clinical Impressions(s) / UC Diagnoses   Final diagnoses:  Bad headache  Nausea and vomiting, unspecified vomiting type     Discharge Instructions      Advised patient may take Fioricet daily or as needed for breakthrough migraine headache pain.  Advised may take Zofran daily or as needed for nausea.  Advised if symptoms worsen go to nearest ED for further evaluation.     ED Prescriptions     Medication Sig Dispense Auth. Provider   butalbital-acetaminophen-caffeine  (FIORICET) 50-325-40 MG tablet Take 1 tablet by mouth every 8 (eight) hours as needed for headache. 15 tablet Eliezer Lofts, FNP   ondansetron (ZOFRAN-ODT) 8 MG disintegrating tablet Take 1 tablet (8 mg total) by mouth every 8 (eight) hours as needed for nausea or vomiting. 24 tablet Eliezer Lofts, FNP  I have reviewed the PDMP during this encounter.   Trevor Iha, FNP 11/29/21 1845

## 2021-11-29 NOTE — ED Triage Notes (Signed)
Pt reports vertigo for the first time Sunday; she also had a HA and she states she took her rx meds and none of it helped. She reports sharp pain in LT temple area. Reports this is different; UC sent her here for possible CT head.

## 2021-11-29 NOTE — ED Notes (Signed)
Patient is being discharged from the Urgent Care and sent to the Emergency Department via POV driven by significant other. Per Eliezer Lofts NP, patient is in need of higher level of care due to severe headache, nausea and vomiting. Patient is aware and verbalizes understanding of plan of care.  Vitals:   11/29/21 1816  BP: 113/77  Pulse: 60  Resp: 12  Temp: 98.4 F (36.9 C)  SpO2: 99%
